# Patient Record
Sex: Male | Born: 2000 | Race: Black or African American | Hispanic: No | Marital: Single
Health system: Southern US, Community
[De-identification: ages and names within clinical notes are randomized; demographics above are authoritative.]

---

## 2019-06-20 ENCOUNTER — Encounter (HOSPITAL_COMMUNITY): Payer: Self-pay | Admitting: Emergency Medicine

## 2019-06-20 ENCOUNTER — Emergency Department (HOSPITAL_COMMUNITY): Payer: BLUE CROSS/BLUE SHIELD

## 2019-06-20 ENCOUNTER — Other Ambulatory Visit: Payer: Self-pay

## 2019-06-20 ENCOUNTER — Emergency Department (HOSPITAL_COMMUNITY)
Admission: EM | Admit: 2019-06-20 | Discharge: 2019-06-20 | Disposition: A | Discharge status: Home / Self Care | Payer: BLUE CROSS/BLUE SHIELD | Attending: Emergency Medicine | Admitting: Emergency Medicine

## 2019-06-20 DIAGNOSIS — Y93G1 Activity, food preparation and clean up: Secondary | ICD-10-CM | POA: Diagnosis not present

## 2019-06-20 DIAGNOSIS — Y999 Unspecified external cause status: Secondary | ICD-10-CM | POA: Insufficient documentation

## 2019-06-20 DIAGNOSIS — Y929 Unspecified place or not applicable: Secondary | ICD-10-CM | POA: Diagnosis not present

## 2019-06-20 DIAGNOSIS — S6992XA Unspecified injury of left wrist, hand and finger(s), initial encounter: Secondary | ICD-10-CM | POA: Diagnosis present

## 2019-06-20 DIAGNOSIS — Z23 Encounter for immunization: Secondary | ICD-10-CM | POA: Diagnosis not present

## 2019-06-20 DIAGNOSIS — W268XXA Contact with other sharp object(s), not elsewhere classified, initial encounter: Secondary | ICD-10-CM | POA: Insufficient documentation

## 2019-06-20 DIAGNOSIS — S61215A Laceration without foreign body of left ring finger without damage to nail, initial encounter: Secondary | ICD-10-CM | POA: Diagnosis not present

## 2019-06-20 MED ORDER — TETANUS-DIPHTH-ACELL PERTUSSIS 5-2.5-18.5 LF-MCG/0.5 IM SUSP
0.5000 mL | Freq: Once | INTRAMUSCULAR | Status: AC
  Administered 2019-06-20: 0.5 mL via INTRAMUSCULAR
  Filled 2019-06-20: qty 0.5

## 2019-06-20 MED ORDER — LIDOCAINE HCL (PF) 1 % IJ SOLN
5.0000 mL | Freq: Once | INTRAMUSCULAR | Status: DC
  Filled 2019-06-20: qty 30

## 2019-06-20 NOTE — ED Triage Notes (Addendum)
Per pt, states he cut his left ring finger on a can in garbage-went to [facility] and they told him to come here to get sutures and a xray

## 2019-06-20 NOTE — ED Notes (Signed)
Pt left without DC paperwork

## 2019-06-20 NOTE — ED Provider Notes (Signed)
[facility]  HOSPITAL-EMERGENCY DEPT Provider Note   CSN: [id number] Arrival date & time: 06/20/19  7253     History laceration  Ryan Meadows is a 19 y.o. male past medical history presents for evaluation of laceration.  Patient cut his hand on metal from canned oranges.  Unknown last tetanus.  Bleeding controlled.  No drainage.   Laceration Location:  Finger Finger laceration location:  L ring finger Length:  50mm Depth:  Cutaneous Quality: straight   Bleeding: controlled   Time since incident:  4 hours Laceration mechanism:  Metal edge Pain details:    Quality:  Aching   Severity:  Mild   Timing:  Intermittent   Progression:  Waxing and waning Foreign body present:  No foreign bodies Relieved by:  None tried Worsened by:  Nothing Ineffective treatments:  None tried Tetanus status:  Out of date Associated symptoms: no fever, no focal weakness, no numbness, no rash, no redness, no swelling and no streaking        History reviewed. No pertinent past medical history.  There are no problems to display for this patient.   History reviewed. No pertinent surgical history.     No family history on file.  Social History   Tobacco Use  . Smoking status: Not on file  Substance Use Topics  . Alcohol use: Not on file  . Drug use: Not on file    Home Medications Prior to Admission medications   Not on File    Allergies    Patient has no allergy information on record.  Review of Systems   Review of Systems  Constitutional: Negative.  Negative for fever.  HENT: Negative.   Respiratory: Negative.   Cardiovascular: Negative.   Gastrointestinal: Negative.   Genitourinary: Negative.  Negative for difficulty urinating.  Musculoskeletal: Negative.   Skin: Positive for wound. Negative for rash.  Neurological: Negative.  Negative for focal weakness.  All other systems reviewed and are negative.   Physical Exam Updated Vital Signs BP (!) 153/90 (BP  Location: Right Arm)   Pulse (!) 52   Temp 98.1 F (36.7 C) (Oral)   Resp 17   SpO2 100%   Physical Exam Vitals and nursing note reviewed.  Constitutional:      General: He is not in acute distress.    Appearance: He is well-developed. He is not ill-appearing, toxic-appearing or diaphoretic.  HENT:     Head: Normocephalic and atraumatic.     Nose: Nose normal.     Mouth/Throat:     Mouth: Mucous membranes are moist.  Eyes:     Pupils: Pupils are equal, round, and reactive to light.  Cardiovascular:     Rate and Rhythm: Normal rate and regular rhythm.     Pulses: Normal pulses.     Heart sounds: Normal heart sounds.  Pulmonary:     Effort: Pulmonary effort is normal. No respiratory distress.     Breath sounds: Normal breath sounds.  Abdominal:     General: Bowel sounds are normal. There is no distension.     Palpations: Abdomen is soft.  Musculoskeletal:        General: Normal range of motion.     Cervical back: Normal range of motion and neck supple.     Comments: Moves all digits to left upper extremity.  No bony tenderness, step-offs.  Skin:    General: Skin is warm and dry.     Comments: 3 mm laceration over left ring finger PIP.  No evidence of tendon or ligament damage.  Unable to visualize bone.  No bleeding or drainage.  Neurological:     Mental Status: He is alert.     Comments: Full and equal handgrip bilaterally.  Full range of motion to all digits.  Intact sensation to radial, ulnar aspect to left digit.       ED Results / Procedures / Treatments   Labs (all labs ordered are listed, but only abnormal results are displayed) Labs Reviewed - No data to display  EKG None  Radiology DG Finger Little Left  Result Date: 06/20/2019 CLINICAL DATA:  Cut finger on garbage can. EXAM: LEFT RING FINGER 2+V; LEFT LITTLE FINGER 2+V COMPARISON:  None FINDINGS: No acute fracture or dislocation. No radiopaque foreign object. Overlap of fingers on the lateral view.  IMPRESSION: No acute osseous abnormality. Electronically Signed   By: Abigail Miyamoto M.D.   On: 06/20/2019 19:24   DG Finger Ring Left  Result Date: 06/20/2019 CLINICAL DATA:  Cut finger on garbage can. EXAM: LEFT RING FINGER 2+V; LEFT LITTLE FINGER 2+V COMPARISON:  None FINDINGS: No acute fracture or dislocation. No radiopaque foreign object. Overlap of fingers on the lateral view. IMPRESSION: No acute osseous abnormality. Electronically Signed   By: Abigail Miyamoto M.D.   On: 06/20/2019 19:24    Procedures Procedures (including critical care time)  Medications Ordered in ED Medications  lidocaine (PF) (XYLOCAINE) 1 % injection 5 mL (has no administration in time range)  Tdap (BOOSTRIX) injection 0.5 mL (0.5 mLs Intramuscular Given 06/20/19 2155)    ED Course  I have reviewed the triage vital signs and the nursing notes.  Pertinent labs & imaging results that were available during my care of the patient were reviewed by me and considered in my medical decision making (see chart for details).  19 year old presents for evaluation of laceration which occurred 4 hours PTA.  Patient with 3 mm superficial laceration over PIP to left ring finger.  Neurovascularly intact.  No bleeding or drainage.  No bony tenderness.  X-rays not show days of fracture, dislocation.  No evidence of tendon, ligament, vascular, bony injury.  Will update tetanus.  Laceration does not require sutures however will do Dermabond, patient placed in static splint.  Discussed wound care.  Patient will follow up outpatient.  The patient has been appropriately medically screened and/or stabilized in the ED. I have low suspicion for any other emergent medical condition which would require further screening, evaluation or treatment in the ED or require inpatient management.    MDM Rules/Calculators/A&P                       Final Clinical Impression(s) / ED Diagnoses Final diagnoses:  Laceration of left ring finger without  foreign body without damage to nail, initial encounter    Rx / DC Orders ED Discharge Orders    None       Henderly, Britni A, PA-C 06/20/19 2246    Quintella Reichert, MD 06/21/19 1046

## 2019-06-20 NOTE — ED Notes (Signed)
Pt ambulatory out of ED.

## 2019-06-20 NOTE — ED Notes (Signed)
PA at bedside.

## 2019-06-20 NOTE — ED Notes (Signed)
Suture supply at bedside 

## 2019-09-09 ENCOUNTER — Ambulatory Visit: Payer: BLUE CROSS/BLUE SHIELD | Attending: Family

## 2019-09-09 DIAGNOSIS — Z23 Encounter for immunization: Secondary | ICD-10-CM

## 2019-09-09 NOTE — Progress Notes (Signed)
   Covid-19 Vaccination Clinic  Name:  Ryan Meadows    MRN: [id number] DOB: 30-Jul-2000  09/09/2019  [name] was observed post Covid-19 immunization for 15 minutes without incident. He was provided with Vaccine Information Sheet and instruction to access the V-Safe system.   [name] was instructed to call 911 with any severe reactions post vaccine: Marland Kitchen Difficulty breathing  . Swelling of face and throat  . A fast heartbeat  . A bad rash all over body  . Dizziness and weakness   Immunizations Administered    Name Date Dose VIS Date Route   Moderna COVID-19 Vaccine 09/09/2019  1:18 PM 0.5 mL 05/18/2019 Intramuscular   Manufacturer: Moderna   Lot: [id number]   NDC: 50518-335-82

## 2019-10-12 ENCOUNTER — Ambulatory Visit: Payer: BLUE CROSS/BLUE SHIELD

## 2019-10-19 ENCOUNTER — Ambulatory Visit: Payer: BLUE CROSS/BLUE SHIELD

## 2019-10-21 ENCOUNTER — Ambulatory Visit: Payer: BLUE CROSS/BLUE SHIELD | Attending: Family

## 2019-10-21 DIAGNOSIS — Z23 Encounter for immunization: Secondary | ICD-10-CM

## 2019-10-21 NOTE — Progress Notes (Signed)
   Covid-19 Vaccination Clinic  Name:  Ryan Meadows    MRN: [id number] DOB: [birth year]  10/21/2019  [name] was observed post Covid-19 immunization for 15 minutes without incident. He was provided with Vaccine Information Sheet and instruction to access the V-Safe system.   [name] was instructed to call 911 with any severe reactions post vaccine: Marland Kitchen Difficulty breathing  . Swelling of face and throat  . A fast heartbeat  . A bad rash all over body  . Dizziness and weakness   Immunizations Administered    Name Date Dose VIS Date Route   Moderna COVID-19 Vaccine 10/21/2019  1:22 PM 0.5 mL 05/2019 Intramuscular   Manufacturer: Moderna   Lot: [id number]   NDC: 59458-592-92

## 2021-04-13 IMAGING — CR DG FINGER LITTLE 2+V*L*
3 series · 3 of 3 positions shown · non-contrast
Comparison: None

CLINICAL DATA: Cut finger on garbage can.

EXAM:
LEFT RING FINGER 2+V; LEFT LITTLE FINGER 2+V

[x finger pa left]
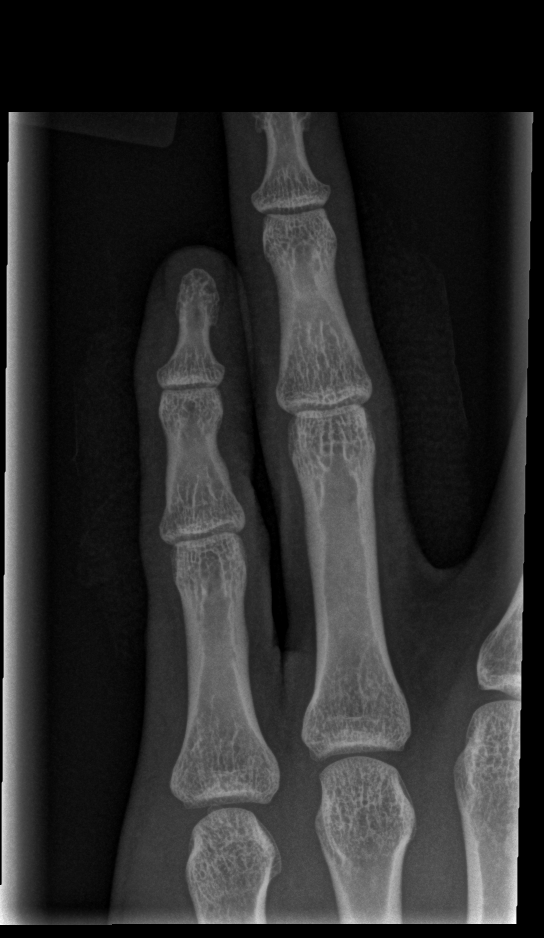

[x finger obl left]
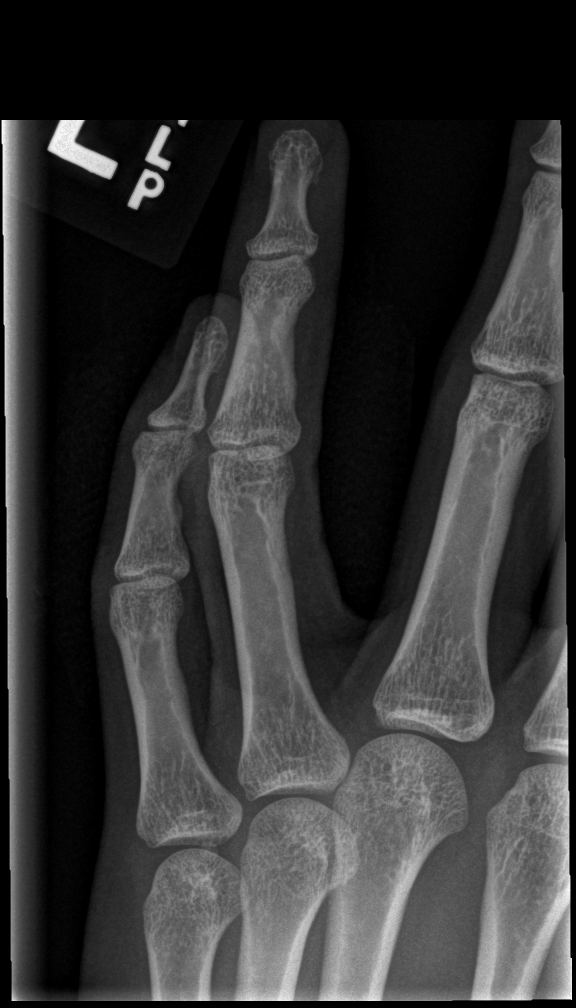

[x finger lat left]
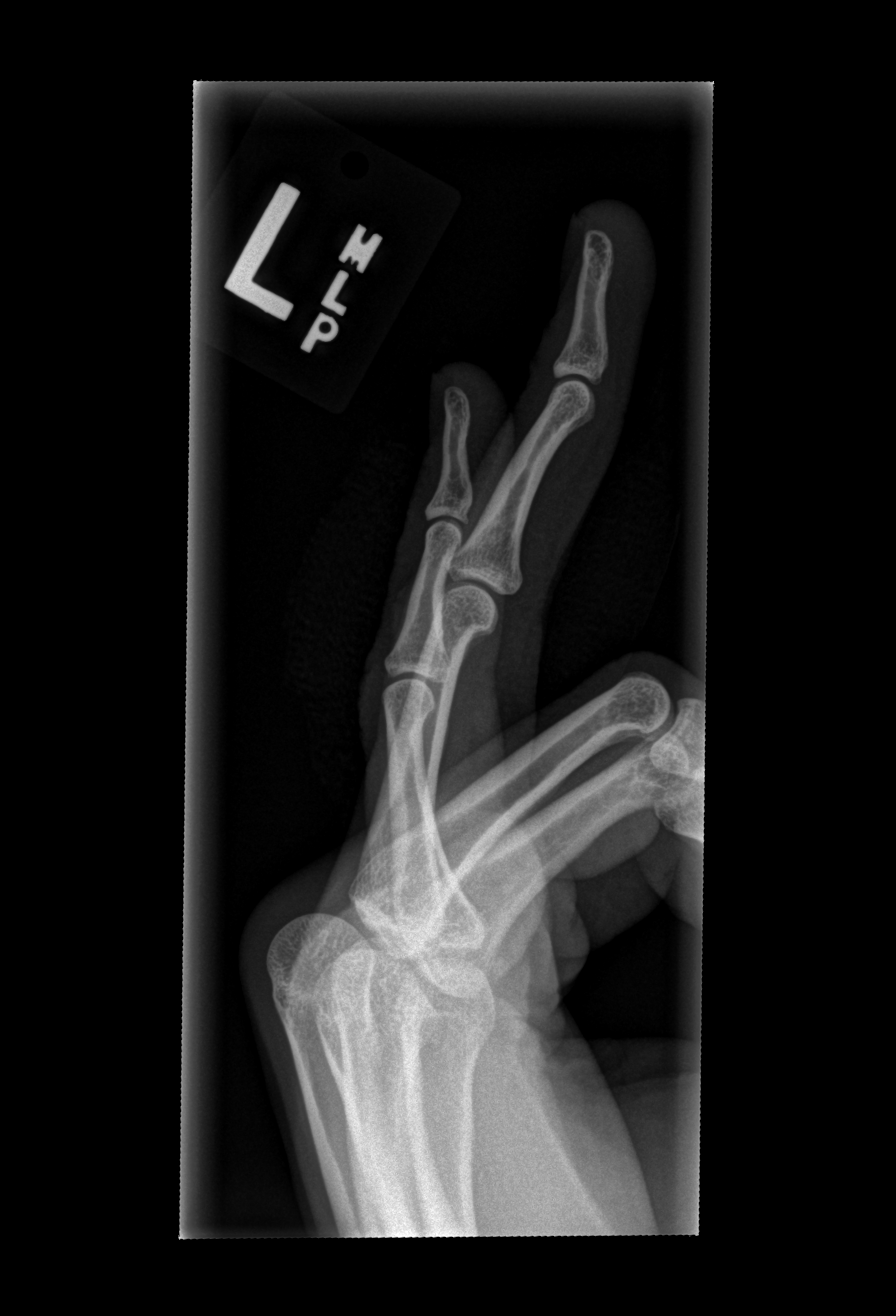

[3 of 3 positions shown; findings below may reference images not displayed]

FINDINGS: No acute fracture or dislocation. No radiopaque foreign object.
Overlap of fingers on the lateral view.
IMPRESSION: No acute osseous abnormality.
